# Patient Record
Sex: Male | Born: 2000 | Race: White | Hispanic: No | Marital: Single | Smoking: Never smoker
Health system: Southern US, Community
[De-identification: ages and names within clinical notes are randomized; demographics above are authoritative.]

## PROBLEM LIST (undated history)

## (undated) DIAGNOSIS — F32A Depression, unspecified: Secondary | ICD-10-CM

## (undated) DIAGNOSIS — F419 Anxiety disorder, unspecified: Secondary | ICD-10-CM

## (undated) HISTORY — DX: Anxiety disorder, unspecified: F41.9

## (undated) HISTORY — DX: Depression, unspecified: F32.A

---
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
## 2019-04-15 DIAGNOSIS — Z20828 Contact with and (suspected) exposure to other viral communicable diseases: Secondary | ICD-10-CM | POA: Diagnosis not present

## 2020-11-07 DIAGNOSIS — Z20822 Contact with and (suspected) exposure to covid-19: Secondary | ICD-10-CM | POA: Diagnosis not present

## 2020-11-29 DIAGNOSIS — Z20822 Contact with and (suspected) exposure to covid-19: Secondary | ICD-10-CM | POA: Diagnosis not present

## 2021-01-04 DIAGNOSIS — Z23 Encounter for immunization: Secondary | ICD-10-CM | POA: Diagnosis not present

## 2021-04-12 DIAGNOSIS — Z6821 Body mass index (BMI) 21.0-21.9, adult: Secondary | ICD-10-CM | POA: Diagnosis not present

## 2021-04-12 DIAGNOSIS — K645 Perianal venous thrombosis: Secondary | ICD-10-CM | POA: Diagnosis not present

## 2021-07-23 DIAGNOSIS — Z6824 Body mass index (BMI) 24.0-24.9, adult: Secondary | ICD-10-CM | POA: Diagnosis not present

## 2021-07-23 DIAGNOSIS — R5383 Other fatigue: Secondary | ICD-10-CM | POA: Diagnosis not present

## 2021-07-23 DIAGNOSIS — R635 Abnormal weight gain: Secondary | ICD-10-CM | POA: Diagnosis not present

## 2021-07-24 DIAGNOSIS — R5383 Other fatigue: Secondary | ICD-10-CM | POA: Diagnosis not present

## 2021-07-24 LAB — LIPID PANEL
Cholesterol: 136 (ref 0–200)
HDL: 48 (ref 35–70)
LDL Cholesterol: 71
LDl/HDL Ratio: 1.5
Triglycerides: 89 (ref 40–160)

## 2021-07-24 LAB — TESTOSTERONE: Testosterone: 593

## 2021-07-24 LAB — CBC AND DIFFERENTIAL: Neutrophils Absolute: 3.1

## 2021-10-15 DIAGNOSIS — F329 Major depressive disorder, single episode, unspecified: Secondary | ICD-10-CM | POA: Diagnosis not present

## 2021-10-15 DIAGNOSIS — Z1331 Encounter for screening for depression: Secondary | ICD-10-CM | POA: Diagnosis not present

## 2021-10-15 DIAGNOSIS — R197 Diarrhea, unspecified: Secondary | ICD-10-CM | POA: Diagnosis not present

## 2021-10-15 DIAGNOSIS — Z68.41 Body mass index (BMI) pediatric, 5th percentile to less than 85th percentile for age: Secondary | ICD-10-CM | POA: Diagnosis not present

## 2021-10-15 DIAGNOSIS — Z23 Encounter for immunization: Secondary | ICD-10-CM | POA: Diagnosis not present

## 2021-10-15 LAB — BASIC METABOLIC PANEL
BUN: 12 (ref 4–21)
CO2: 25 — AB (ref 13–22)
Chloride: 101 (ref 99–108)
Creatinine: 1.2 (ref 0.6–1.3)
Glucose: 91
Potassium: 4.9 mEq/L (ref 3.5–5.1)
Sodium: 141 (ref 137–147)

## 2021-10-15 LAB — HEPATIC FUNCTION PANEL
ALT: 17 U/L (ref 10–40)
AST: 13 — AB (ref 14–40)
Alkaline Phosphatase: 88 (ref 25–125)
Bilirubin, Total: 0.7

## 2021-10-15 LAB — COMPREHENSIVE METABOLIC PANEL
Albumin: 4.8 (ref 3.5–5.0)
Calcium: 10.1 (ref 8.7–10.7)
Globulin: 2.4
eGFR: 89

## 2021-10-15 LAB — TSH: TSH: 1.57 (ref 0.41–5.90)

## 2021-10-15 LAB — CBC: RBC: 6.01 — AB (ref 3.87–5.11)

## 2021-10-15 LAB — CBC AND DIFFERENTIAL
HCT: 52 (ref 41–53)
Hemoglobin: 17.3 (ref 13.5–17.5)
Platelets: 236 10*3/uL (ref 150–400)
WBC: 5

## 2021-11-15 DIAGNOSIS — F329 Major depressive disorder, single episode, unspecified: Secondary | ICD-10-CM | POA: Diagnosis not present

## 2021-11-15 DIAGNOSIS — Z6824 Body mass index (BMI) 24.0-24.9, adult: Secondary | ICD-10-CM | POA: Diagnosis not present

## 2022-12-08 DIAGNOSIS — F32A Depression, unspecified: Secondary | ICD-10-CM | POA: Diagnosis not present

## 2023-02-20 ENCOUNTER — Ambulatory Visit
Admission: RE | Admit: 2023-02-20 | Discharge: 2023-02-20 | Disposition: A | Discharge status: Home / Self Care | Payer: BC Managed Care – PPO | Attending: Physician Assistant | Admitting: Physician Assistant

## 2023-02-20 ENCOUNTER — Ambulatory Visit (INDEPENDENT_AMBULATORY_CARE_PROVIDER_SITE_OTHER): Payer: Self-pay | Admitting: Physician Assistant

## 2023-02-20 ENCOUNTER — Encounter: Payer: Self-pay | Admitting: Physician Assistant

## 2023-02-20 ENCOUNTER — Ambulatory Visit
Admission: RE | Admit: 2023-02-20 | Discharge: 2023-02-20 | Disposition: A | Discharge status: Home / Self Care | Payer: BC Managed Care – PPO | Source: Ambulatory Visit | Attending: Physician Assistant | Admitting: Physician Assistant

## 2023-02-20 VITALS — BP 100/60 | HR 62 | Temp 98.7°F | Ht 74.0 in | Wt 230.0 lb

## 2023-02-20 DIAGNOSIS — M79642 Pain in left hand: Secondary | ICD-10-CM

## 2023-02-20 DIAGNOSIS — F419 Anxiety disorder, unspecified: Secondary | ICD-10-CM | POA: Insufficient documentation

## 2023-02-20 DIAGNOSIS — M79641 Pain in right hand: Secondary | ICD-10-CM | POA: Insufficient documentation

## 2023-02-20 DIAGNOSIS — R41840 Attention and concentration deficit: Secondary | ICD-10-CM

## 2023-02-20 DIAGNOSIS — F411 Generalized anxiety disorder: Secondary | ICD-10-CM | POA: Insufficient documentation

## 2023-02-20 MED ORDER — ESCITALOPRAM OXALATE 20 MG PO TABS: 20.0000 mg | ORAL_TABLET | Freq: Every day | ORAL | 1 refills | Status: DC

## 2023-02-20 MED ORDER — DICLOFENAC SODIUM 1 % EX GEL: 2.0000 g | Freq: Four times a day (QID) | CUTANEOUS | 1 refills | Status: DC

## 2023-02-20 NOTE — Progress Notes (Signed)
Date:  02/20/2023   Name:  [name]   DOB:  Feb 03, 2001   MRN:  [id number]   Chief Complaint: Establish Care, ADHD, and Hand Pain  Hand Pain  Incident onset: X1 month. The incident occurred at the gym. The injury mechanism was repetitive motion. Pain location: left and right knuckles. Quality: discomfort. The pain does not radiate. The pain is at a severity of 3/10. The pain is mild. Pain course: comes and goes with movement. The symptoms are aggravated by movement. Treatments tried: ibuprofen. The treatment provided mild relief.   HPI [name] is a pleasant 22 year old male with a history of anxiety and depression who presents new to this practice, but previously seen by me at another practice, here to establish care and refill Lexapro which he feels is controlling mood disorders well and at a stable dose.  He also expresses interest in restarting medication for ADHD, states he was diagnosed as a child but has not taken medication for this problem in years.  Recently, college courses have prompted him to reconsider using medication to increase focus.  Primarily reports difficulty concentrating.  In addition, he would like me to evaluate his knuckles following injury from repeated impact having started boxing about 1 month ago and practicing on a punching bag with bare hands.  After the first day of practice, he noticed bilateral knuckle pain, but continued to return to the punching bag for several days in a row with worsening pain.  Eventually realized he should stop, but reports continued pain when he applies pressure to a closed fist.  He has taken a break from the punching bag.  He would be interested in x-ray today to rule out any fractures, given his persistent pain.   Recent Labs  No results found for: "NA", "K", "CL", "CO2", "GLUCOSE", "BUN", "CREATININE", "CALCIUM", "PROT", "ALBUMIN", "AST", "ALT", "ALKPHOS", "BILITOT", "GFRNONAA", "GFRAA"  No results found for: "WBC", "HGB",  "HCT", "MCV", "PLT" No results found for: "HGBA1C" No results found for: "CHOL", "HDL", "LDLCALC", "LDLDIRECT", "TRIG", "CHOLHDL" No results found for: "TSH"  Review of Systems  Musculoskeletal:        Bilateral knuckle pain    Patient Active Problem List   Diagnosis Date Noted   Generalized anxiety disorder 02/20/2023    Not on File  History reviewed. No pertinent surgical history.  Social History   Tobacco Use   Smoking status: Never   Smokeless tobacco: Never  Vaping Use   Vaping Use: Never used  Substance Use Topics   Alcohol use: Not Currently   Drug use: Never     Medication list has been reviewed and updated.  Current Meds  Medication Sig   diclofenac Sodium (VOLTAREN) 1 % GEL Apply 2 g topically 4 (four) times daily. Use on affected joint up to 4x/day as needed. 2 grams is roughly 4 fingertips' worth of gel.   [DISCONTINUED] escitalopram (LEXAPRO) 20 MG tablet Take 20 mg by mouth daily.       02/20/2023    3:23 PM  GAD 7 : Generalized Anxiety Score  Nervous, Anxious, on Edge 1  Control/stop worrying 1  Worry too much - different things 1  Trouble relaxing 0  Restless 1  Easily annoyed or irritable 1  Afraid - awful might happen 1  Total GAD 7 Score 6  Anxiety Difficulty Not difficult at all       02/20/2023    3:22 PM  Depression screen PHQ 2/9  Decreased Interest 0  Down, Depressed, Hopeless 0  PHQ - 2 Score 0  Altered sleeping 0  Tired, decreased energy 1  Change in appetite 0  Feeling bad or failure about yourself  1  Trouble concentrating 3  Moving slowly or fidgety/restless 0  Suicidal thoughts 0  PHQ-9 Score 5  Difficult doing work/chores Not difficult at all   Adult ADHD Self Report Scale (most recent)     Adult ADHD Self-Report Scale (ASRS-v1.1) Symptom Checklist - 02/20/23 1518       Part A   1. How often do you have trouble wrapping up the final details of a project, once the challenging parts have been done? Very Often  2.  How often do you have difficulty getting things done in order when you have to do a task that requires organization? Often    3. How often do you have problems remembering appointments or obligations? Rarely  4. When you have a task that requires a lot of thought, how often do you avoid or delay getting started? Very Often    5. How often do you fidget or squirm with your hands or feet when you have to sit down for a long time? Often  6. How often do you feel overly active and compelled to do things, like you were driven by a motor? Sometimes      Part B   7. How often do you make careless mistakes when you have to work on a boring or difficult project? Often  8. How often do you have difficulty keeping your attention when you are doing boring or repetitive work? Very Often    9. How often do you have difficulty concentrating on what people say to you, even when they are speaking to you directly? Sometimes  10. How often do you misplace or have difficulty finding things at home or at work? Sometimes    11. How often are you distracted by activity or noise around you? Sometimes  12. How often do you leave your seat in meetings or other situations in which you are expected to remain seated? Rarely    13. How often do you feel restless or fidgety? Very Often  14. How often do you have difficulty unwinding and relaxing when you have time to yourself? Rarely    15. How often do you find yourself talking too much when you are in social situations? Sometimes  16. When you are in a conversation, how often do you find yourself finishing the sentences of the people you are talking to, before they can finish them themselves? Rarely    17. How often do you have difficulty waiting your turn in situations when turn taking is required? Rarely  18. How often do you interrupt others when they are busy? Rarely      Comment   How old were you when these problems first began to occur? 13               BP Readings  from Last 3 Encounters:  02/20/23 100/60    Wt Readings from Last 3 Encounters:  02/20/23 230 lb (104.3 kg)    BP 100/60   Pulse 62   Temp 98.7 F (37.1 C) (Oral)   Ht 6\' 2"  (1.88 m)   Wt 230 lb (104.3 kg)   SpO2 97%   BMI 29.53 kg/m   Physical Exam Vitals and nursing note reviewed.  Constitutional:      Appearance: Normal appearance.  Cardiovascular:  Rate and Rhythm: Normal rate and regular rhythm.     Heart sounds: Normal heart sounds.  Pulmonary:     Effort: Pulmonary effort is normal.     Breath sounds: Normal breath sounds.  Musculoskeletal:     Comments: No obvious deformity of either hand. Central knuckles are prominent, but patient states this is baseline. No erythema, ecchymoses, or breaks in the skin. Mild tenderness at the MCPs but only with significant axial pressure on closed fist. Full ROM of fingers and wrist.   Psychiatric:        Mood and Affect: Mood normal.        Behavior: Behavior normal.      Assessment and Plan:  1. Generalized anxiety disorder Stable on Lexapro, does not need refill at this time but will send additional refills available to him upon request - escitalopram (LEXAPRO) 20 MG tablet; Take 1 tablet (20 mg total) by mouth daily.  Dispense: 90 tablet; Refill: 1  2. Bilateral hand pain Low suspicion for fracture, more likely this is acute insult from repetitive trauma with a punching bag.  Will obtain x-rays today to confirm.  Advised patient try applying topical diclofenac to the knuckles, and if this is ineffective he should use oral NSAIDs.  Regardless of the treatment regimen, I advised he take at least another 2 weeks away from the punching bag to allow proper time to heal.  When he does return, emphasized the importance of padding the hands/knuckles either with boxing tape or gloves. - DG Hand Complete Right - DG Hand Complete Left - diclofenac Sodium (VOLTAREN) 1 % GEL; Apply 2 g topically 4 (four) times daily. Use on affected  joint up to 4x/day as needed. 2 grams is roughly 4 fingertips' worth of gel.  Dispense: 100 g; Refill: 1  3. Difficulty concentrating ASRS today would suggest ADHD.  Given his concurrent anxiety disorder, need to use caution with stimulant medications to avoid exacerbating anxiety.  Because of this, submitting referral to behavioral health for adult ADHD evaluation. Depending on when they can see him, might consider trying nonstimulant alternative in the interim such as atomoxetine or guanfacine - Ambulatory referral to Psychiatry   Return in about 3 months (around 05/22/2023) for OV dep/anx/ADHD.   Partially dictated using Animal nutritionist. Any errors are unintentional.  Alvester Morin, PA-C, DMSc, Nutritionist [facility] [phone]

## 2023-03-10 ENCOUNTER — Telehealth: Payer: Self-pay

## 2023-03-10 NOTE — Telephone Encounter (Signed)
Received fax stating medication was not covered by insurance and pt dd not fill the prescription. Called pt to see if he needed an alternative sent in. Could not leave VM. VM was full.  KP

## 2023-03-10 NOTE — Telephone Encounter (Signed)
Called patient to advise him that diclofenac was not covered by insurance. No answer, VM full.

## 2023-03-10 NOTE — Telephone Encounter (Signed)
Pt said it was ok.  He does not really need it right now.

## 2023-03-11 NOTE — Telephone Encounter (Signed)
Noted  KP 

## 2023-03-24 ENCOUNTER — Telehealth: Payer: Self-pay | Admitting: Physician Assistant

## 2023-03-24 NOTE — Telephone Encounter (Signed)
Referral Request - Has patient seen PCP for this complaint? yes *If NO, is insurance requiring patient see PCP for this issue before PCP can refer them? Referral for which specialty: [facility]la [facility] Preferred provider/office: ? Reason for referral: short attention span

## 2023-03-25 ENCOUNTER — Telehealth: Payer: Self-pay | Admitting: Physician Assistant

## 2023-03-25 NOTE — Telephone Encounter (Signed)
Copied from CRM [id number]. Topic: General - Other >> Mar 25, 2023  9:26 AM Carrielelia G wrote: Reason for CRM:  pt is calling in regards to [facility] referral, that he discussed with PA Mordecai Maes

## 2023-03-25 NOTE — Telephone Encounter (Signed)
Patient called saying he might have missed a call from [facility] for his referral for behavioral health.  Would like # for their office. Gave # to patient and told him to call them, and schedule appt.  - Rebekha Diveley

## 2023-03-26 NOTE — Telephone Encounter (Signed)
Pt was called by Chassidy and given the number to [facility]. Please review previous telephone call.  KP

## 2023-05-25 ENCOUNTER — Ambulatory Visit: Payer: BC Managed Care – PPO | Admitting: Physician Assistant

## 2023-05-26 ENCOUNTER — Encounter: Payer: Self-pay | Admitting: Physician Assistant

## 2023-09-30 ENCOUNTER — Encounter: Payer: Self-pay | Admitting: Physician Assistant

## 2023-09-30 ENCOUNTER — Ambulatory Visit: Payer: BC Managed Care – PPO | Admitting: Physician Assistant

## 2023-09-30 VITALS — BP 110/76 | HR 92 | Temp 97.7°F | Ht 74.0 in | Wt 240.0 lb

## 2023-09-30 DIAGNOSIS — F411 Generalized anxiety disorder: Secondary | ICD-10-CM

## 2023-09-30 DIAGNOSIS — Z0000 Encounter for general adult medical examination without abnormal findings: Secondary | ICD-10-CM | POA: Diagnosis not present

## 2023-09-30 MED ORDER — ESCITALOPRAM OXALATE 10 MG PO TABS: 10.0000 mg | ORAL_TABLET | Freq: Every day | ORAL | 1 refills | Status: DC

## 2023-09-30 NOTE — Patient Instructions (Signed)
-  It was a pleasure to see you today! Please review your visit summary for helpful information -Lab results are usually available within 1-2 days and we will call once reviewed -I would encourage you to follow your care via MyChart where you can access lab results, notes, messages, and more -If you feel that we did a nice job today, please complete your after-visit survey and leave us a Google review! Your CMA today was Kieandra and your provider was Dan Waddell, PA-C, DMSc -Please return for follow-up in about 1 year  

## 2023-09-30 NOTE — Progress Notes (Signed)
Date:  09/30/2023   Name:  [name]   DOB:  2001-07-23   MRN:  [id number]   Chief Complaint: Anxiety and Annual Exam  HPI Yuepheng presents for routine physical in addition to f/u on GAD and suspected ADHD for which he was referred to [facility] last visit but never went. Patient informs me he is not enrolled in classes this semester due to financial stressors, just working full-time.  Is interested in tapering off escitalopram.  He exercises regularly (every other day for 90 minutes) and consumes ~200g protein daily.  Feels well overall.  Would like to check routine labs, admits he does consume a fair amount of fast food.  Last Physical: 2023 Last Dental Exam: 2022  Last Eye Exam: Uncertain     Medication list has been reviewed and updated.  Current Meds  Medication Sig   [DISCONTINUED] escitalopram (LEXAPRO) 20 MG tablet Take 1 tablet (20 mg total) by mouth daily.     Review of Systems  Constitutional:  Negative for activity change, appetite change, fatigue and unexpected weight change.  HENT:  Negative for dental problem, hearing loss and trouble swallowing.   Eyes:  Negative for visual disturbance.  Respiratory:  Negative for cough, chest tightness, shortness of breath and wheezing.   Cardiovascular:  Negative for chest pain, palpitations and leg swelling.  Gastrointestinal:  Negative for abdominal distention, abdominal pain, blood in stool, constipation and diarrhea.  Endocrine: Negative for polydipsia, polyphagia and polyuria.  Genitourinary:  Negative for difficulty urinating, dysuria, enuresis, frequency, hematuria and testicular pain.  Musculoskeletal:  Negative for arthralgias, gait problem and joint swelling.  Skin:  Negative for rash and wound.  Neurological:  Negative for dizziness, syncope, weakness and headaches.  Psychiatric/Behavioral:  Positive for decreased concentration, dysphoric mood and sleep disturbance. Negative for behavioral problems. The patient is  nervous/anxious.     Patient Active Problem List   Diagnosis Date Noted   Generalized anxiety disorder 02/20/2023    Not on File   There is no immunization history on file for this patient.  History reviewed. No pertinent surgical history.  Social History   Tobacco Use   Smoking status: Never   Smokeless tobacco: Never  Vaping Use   Vaping status: Never Used  Substance Use Topics   Alcohol use: Not Currently   Drug use: Never    Family History  Problem Relation Age of Onset   Cancer Father    Diabetes Maternal Grandfather         09/30/2023   10:55 AM 02/20/2023    3:23 PM  GAD 7 : Generalized Anxiety Score  Nervous, Anxious, on Edge 1 1  Control/stop worrying 1 1  Worry too much - different things 1 1  Trouble relaxing 1 0  Restless 1 1  Easily annoyed or irritable 2 1  Afraid - awful might happen 1 1  Total GAD 7 Score 8 6  Anxiety Difficulty Not difficult at all Not difficult at all       09/30/2023   10:55 AM 02/20/2023    3:22 PM  Depression screen PHQ 2/9  Decreased Interest 1 0  Down, Depressed, Hopeless 1 0  PHQ - 2 Score 2 0  Altered sleeping 2 0  Tired, decreased energy 1 1  Change in appetite 1 0  Feeling bad or failure about yourself  2 1  Trouble concentrating 2 3  Moving slowly or fidgety/restless 1 0  Suicidal thoughts 0 0  PHQ-9  Score 11 5  Difficult doing work/chores Not difficult at all Not difficult at all    BP Readings from Last 3 Encounters:  09/30/23 110/76  02/20/23 100/60    Wt Readings from Last 3 Encounters:  09/30/23 240 lb (108.9 kg)  02/20/23 230 lb (104.3 kg)    BP 110/76   Pulse 92   Temp 97.7 F (36.5 C) (Oral)   Ht 6\' 2"  (1.88 m)   Wt 240 lb (108.9 kg)   SpO2 96%   BMI 30.81 kg/m   Physical Exam Vitals and nursing note reviewed.  Constitutional:      Appearance: Normal appearance.  HENT:     Ears:     Comments: EAC clear bilaterally with good view of TM which is without effusion or erythema.      Nose: Nose normal.     Mouth/Throat:     Mouth: Mucous membranes are moist. No oral lesions.     Dentition: Normal dentition.     Pharynx: No posterior oropharyngeal erythema.  Eyes:     Extraocular Movements: Extraocular movements intact.     Conjunctiva/sclera: Conjunctivae normal.     Pupils: Pupils are equal, round, and reactive to light.  Neck:     Thyroid: No thyromegaly.  Cardiovascular:     Rate and Rhythm: Normal rate and regular rhythm.     Heart sounds: No murmur heard.    No friction rub. No gallop.     Comments: Pulses 2+ at radial, PT, DP bilaterally. No carotid bruit. No peripheral edema Pulmonary:     Effort: Pulmonary effort is normal.     Breath sounds: Normal breath sounds.  Abdominal:     General: Bowel sounds are normal.     Palpations: Abdomen is soft. There is no mass.     Tenderness: There is no abdominal tenderness.  Genitourinary:    Comments: Genital/rectal exam deferred. Reviewed technique for testicular self-exam. Musculoskeletal:     Comments: Full ROM with strength 5/5 bilateral upper and lower extremities  Lymphadenopathy:     Cervical: No cervical adenopathy.  Skin:    General: Skin is warm.     Capillary Refill: Capillary refill takes less than 2 seconds.     Findings: No lesion or rash.  Neurological:     Mental Status: He is alert and oriented to person, place, and time.     Gait: Gait is intact.  Psychiatric:        Mood and Affect: Mood normal. Affect is flat.        Behavior: Behavior normal.     Recent Labs     Component Value Date/Time   NA 141 10/15/2021 0000   K 4.9 10/15/2021 0000   CL 101 10/15/2021 0000   CO2 25 (A) 10/15/2021 0000   BUN 12 10/15/2021 0000   CREATININE 1.2 10/15/2021 0000   CALCIUM 10.1 10/15/2021 0000   ALBUMIN 4.8 10/15/2021 0000   AST 13 (A) 10/15/2021 0000   ALT 17 10/15/2021 0000   ALKPHOS 88 10/15/2021 0000    Lab Results  Component Value Date   WBC 5.0 10/15/2021   HGB 17.3  10/15/2021   HCT 52 10/15/2021   PLT 236 10/15/2021   No results found for: "HGBA1C" Lab Results  Component Value Date   CHOL 136 07/24/2021   HDL 48 07/24/2021   LDLCALC 71 07/24/2021   TRIG 89 07/24/2021   Lab Results  Component Value Date   TSH 1.57 10/15/2021  Assessment and Plan:  1. Annual physical exam Seemingly healthy patient with no abnormalities on exam. Encouraged healthy lifestyle including regular physical activity and consumption of whole fruits and vegetables. Encouraged routine dental and eye exams. STI screening offered but deferred, asymptomatic.   - CBC with Differential/Platelet - Comprehensive metabolic panel - TSH  2. Generalized anxiety disorder Discussed with patient that his GAD and PHQ scores have increased compared to last visit, which he attributes mainly to financial stressors.  Despite this, he would still like to proceed with taper off of Lexapro.  He has at least 7 of the 20 mg tablets remaining, so we discussed a taper regimen of 0.5 tablet daily x1wk followed by 0.5 tablet every other day until gone. Sending refill at the 10mg  dose in case he desires a slower taper.   - escitalopram (LEXAPRO) 10 MG tablet; Take 1 tablet (10 mg total) by mouth daily.  Dispense: 30 tablet; Refill: 1   Return in about 1 year (around 09/29/2024) for CPE.    Alvester Morin, PA-C, DMSc, Nutritionist [facility] [phone]

## 2023-10-01 LAB — CBC WITH DIFFERENTIAL/PLATELET
Basophils Absolute: 0 10*3/uL (ref 0.0–0.2)
Basos: 1 %
EOS (ABSOLUTE): 0.1 10*3/uL (ref 0.0–0.4)
Eos: 2 %
Hematocrit: 50.2 % (ref 37.5–51.0)
Hemoglobin: 16.2 g/dL (ref 13.0–17.7)
Immature Grans (Abs): 0 10*3/uL (ref 0.0–0.1)
Immature Granulocytes: 0 %
Lymphocytes Absolute: 1.5 10*3/uL (ref 0.7–3.1)
Lymphs: 28 %
MCH: 28.5 pg (ref 26.6–33.0)
MCHC: 32.3 g/dL (ref 31.5–35.7)
MCV: 88 fL (ref 79–97)
Monocytes Absolute: 0.5 10*3/uL (ref 0.1–0.9)
Monocytes: 9 %
Neutrophils Absolute: 3.3 10*3/uL (ref 1.4–7.0)
Neutrophils: 60 %
Platelets: 239 10*3/uL (ref 150–450)
RBC: 5.69 x10E6/uL (ref 4.14–5.80)
RDW: 13.2 % (ref 11.6–15.4)
WBC: 5.5 10*3/uL (ref 3.4–10.8)

## 2023-10-01 LAB — COMPREHENSIVE METABOLIC PANEL
ALT: 38 [IU]/L (ref 0–44)
AST: 24 [IU]/L (ref 0–40)
Albumin: 4.4 g/dL (ref 4.3–5.2)
Alkaline Phosphatase: 75 [IU]/L (ref 44–121)
BUN/Creatinine Ratio: 17 (ref 9–20)
BUN: 18 mg/dL (ref 6–20)
Bilirubin Total: 0.3 mg/dL (ref 0.0–1.2)
CO2: 27 mmol/L (ref 20–29)
Calcium: 9.8 mg/dL (ref 8.7–10.2)
Chloride: 99 mmol/L (ref 96–106)
Creatinine, Ser: 1.07 mg/dL (ref 0.76–1.27)
Globulin, Total: 2.6 g/dL (ref 1.5–4.5)
Glucose: 69 mg/dL — ABNORMAL LOW (ref 70–99)
Potassium: 4.6 mmol/L (ref 3.5–5.2)
Sodium: 139 mmol/L (ref 134–144)
Total Protein: 7 g/dL (ref 6.0–8.5)
eGFR: 101 mL/min/{1.73_m2} (ref >59)

## 2023-10-01 LAB — TSH: TSH: 1.38 u[IU]/mL (ref 0.450–4.500)

## 2023-10-02 ENCOUNTER — Other Ambulatory Visit: Payer: Self-pay | Admitting: Physician Assistant

## 2023-10-14 ENCOUNTER — Other Ambulatory Visit: Payer: Self-pay | Admitting: Physician Assistant

## 2023-10-14 DIAGNOSIS — F411 Generalized anxiety disorder: Secondary | ICD-10-CM

## 2023-10-16 NOTE — Telephone Encounter (Signed)
Requested medications are due for refill today.  yes  Requested medications are on the active medications list.  yes  Last refill. 09/30/2023 #30 1 rf  Future visit scheduled.   yes  Notes to clinic.  Provider sent a message to pt. Pt was to taper off of this medication. No response from pt yet. Please advise.    Requested Prescriptions  Pending Prescriptions Disp Refills   escitalopram (LEXAPRO) 10 MG tablet [Pharmacy Med Name: ESCITALOPRAM 10 MG TABLET] 90 tablet 1    Sig: TAKE 1 TABLET BY MOUTH EVERY DAY     Psychiatry:  Antidepressants - SSRI Passed - 10/14/2023  8:29 AM      Passed - Valid encounter within last 6 months    Recent Outpatient Visits           2 weeks ago Annual physical exam   [facility] at [facility], [place], [place]   7 months ago Generalized anxiety disorder   [facility] at [facility], [place] Alar, [place]       Future Appointments             In 11 months Mordecai Maes, [place] Alar, PA [facility] at [facility], [facility]

## 2023-10-20 ENCOUNTER — Other Ambulatory Visit: Payer: Self-pay | Admitting: Physician Assistant

## 2023-10-21 ENCOUNTER — Ambulatory Visit: Payer: BC Managed Care – PPO | Admitting: Physician Assistant

## 2023-10-21 ENCOUNTER — Encounter: Payer: Self-pay | Admitting: Physician Assistant

## 2023-10-21 VITALS — BP 118/76 | HR 81 | Temp 97.7°F | Ht 74.0 in | Wt 238.0 lb

## 2023-10-21 DIAGNOSIS — G44209 Tension-type headache, unspecified, not intractable: Secondary | ICD-10-CM | POA: Diagnosis not present

## 2023-10-21 MED ORDER — BUTALBITAL-APAP-CAFFEINE 50-325-40 MG PO TABS: 1.0000 | ORAL_TABLET | Freq: Four times a day (QID) | ORAL | 1 refills | Status: DC | PRN

## 2023-10-21 NOTE — Progress Notes (Signed)
Date:  10/21/2023   Name:  [name]   DOB:  02-12-2001   MRN:  [id number]   Chief Complaint: Headache  Headache  This is a chronic problem. Episode onset: 3-4 weeks. Episode frequency: comes and goes. The problem has been unchanged. The pain is located in the Frontal and temporal region. The pain does not radiate. The pain quality is similar to prior headaches. The quality of the pain is described as aching and sharp. The pain is at a severity of 5/10. The pain is moderate. Associated symptoms include eye pain. The symptoms are aggravated by bright light (caffeine). He has tried NSAIDs for the symptoms. The treatment provided no relief.   Xain presents for evaluation of near-daily frontal/temporal headaches for the past 3 weeks or so. He does not wake up with the headaches, but shortly after waking he thinks it starts to come on. He thinks it could be related to stress. Also recently tapered escitalopram. Has been taking ibuprofen almost daily.    Medication list has been reviewed and updated.  Current Meds  Medication Sig   butalbital-acetaminophen-caffeine (FIORICET) 50-325-40 MG tablet Take 1 tablet by mouth every 6 (six) hours as needed for headache.     Review of Systems  Eyes:  Positive for pain.  Neurological:  Positive for headaches.    Patient Active Problem List   Diagnosis Date Noted   Generalized anxiety disorder 02/20/2023    No Known Allergies   There is no immunization history on file for this patient.  History reviewed. No pertinent surgical history.  Social History   Tobacco Use   Smoking status: Never   Smokeless tobacco: Never  Vaping Use   Vaping status: Never Used  Substance Use Topics   Alcohol use: Not Currently   Drug use: Never    Family History  Problem Relation Age of Onset   Cancer Father    Diabetes Maternal Grandfather         10/21/2023    9:09 AM 09/30/2023   10:55 AM 02/20/2023    3:23 PM  GAD 7 : Generalized  Anxiety Score  Nervous, Anxious, on Edge 0 1 1  Control/stop worrying 0 1 1  Worry too much - different things 0 1 1  Trouble relaxing 1 1 0  Restless 0 1 1  Easily annoyed or irritable 1 2 1   Afraid - awful might happen 0 1 1  Total GAD 7 Score 2 8 6   Anxiety Difficulty Not difficult at all Not difficult at all Not difficult at all       10/21/2023    9:08 AM 09/30/2023   10:55 AM 02/20/2023    3:22 PM  Depression screen PHQ 2/9  Decreased Interest 0 1 0  Down, Depressed, Hopeless 0 1 0  PHQ - 2 Score 0 2 0  Altered sleeping 2 2 0  Tired, decreased energy 1 1 1   Change in appetite 1 1 0  Feeling bad or failure about yourself  0 2 1  Trouble concentrating 1 2 3   Moving slowly or fidgety/restless 0 1 0  Suicidal thoughts 0 0 0  PHQ-9 Score 5 11 5   Difficult doing work/chores Not difficult at all Not difficult at all Not difficult at all    BP Readings from Last 3 Encounters:  10/21/23 118/76  09/30/23 110/76  02/20/23 100/60    Wt Readings from Last 3 Encounters:  10/21/23 238 lb (108 kg)  09/30/23 240  lb (108.9 kg)  02/20/23 230 lb (104.3 kg)    BP 118/76   Pulse 81   Temp 97.7 F (36.5 C) (Oral)   Ht 6\' 2"  (1.88 m)   Wt 238 lb (108 kg)   SpO2 97%   BMI 30.56 kg/m   Physical Exam Vitals and nursing note reviewed.  Constitutional:      Appearance: Normal appearance.  HENT:     Head:     Comments: No tenderness of the frontal sinuses or temples.  Eyes:     Pupils: Pupils are equal, round, and reactive to light.  Cardiovascular:     Rate and Rhythm: Normal rate.  Pulmonary:     Effort: Pulmonary effort is normal.  Abdominal:     General: There is no distension.  Musculoskeletal:        General: Normal range of motion.  Skin:    General: Skin is warm and dry.  Neurological:     Mental Status: He is alert and oriented to person, place, and time.     Gait: Gait is intact.  Psychiatric:        Mood and Affect: Mood and affect normal.     Recent  Labs     Component Value Date/Time   NA 139 09/30/2023 1145   K 4.6 09/30/2023 1145   CL 99 09/30/2023 1145   CO2 27 09/30/2023 1145   GLUCOSE 69 (L) 09/30/2023 1145   BUN 18 09/30/2023 1145   CREATININE 1.07 09/30/2023 1145   CALCIUM 9.8 09/30/2023 1145   PROT 7.0 09/30/2023 1145   ALBUMIN 4.4 09/30/2023 1145   AST 24 09/30/2023 1145   ALT 38 09/30/2023 1145   ALKPHOS 75 09/30/2023 1145   BILITOT 0.3 09/30/2023 1145    Lab Results  Component Value Date   WBC 5.5 09/30/2023   HGB 16.2 09/30/2023   HCT 50.2 09/30/2023   MCV 88 09/30/2023   PLT 239 09/30/2023   No results found for: "HGBA1C" Lab Results  Component Value Date   CHOL 136 07/24/2021   HDL 48 07/24/2021   LDLCALC 71 07/24/2021   TRIG 89 07/24/2021   Lab Results  Component Value Date   TSH 1.380 09/30/2023     Assessment and Plan:  1. Tension headache Suspect tension headaches, likely multifactorial with contributing factors of escitalopram discontinuation, medication overuse/rebound headaches, recent weather changes including barometric variation.  Advised to alternate OTC analgesics, practice stress relief techniques, neck stretching, heat applied to the neck, good sleep.  Will prescribe Fioricet for use only when headache is quite severe.  Patient understands this may cause sedation. - butalbital-acetaminophen-caffeine (FIORICET) 50-325-40 MG tablet; Take 1 tablet by mouth every 6 (six) hours as needed for headache.  Dispense: 14 tablet; Refill: 1    Return in about 10 days (around 10/31/2023) for VIRTUAL f/u headaches.    Alvester Morin, PA-C, DMSc, Nutritionist [facility] Care and Sports Medicine [facility] [phone]

## 2024-03-29 ENCOUNTER — Ambulatory Visit: Payer: Self-pay

## 2024-03-29 DIAGNOSIS — M722 Plantar fascial fibromatosis: Secondary | ICD-10-CM | POA: Insufficient documentation

## 2024-03-29 NOTE — Telephone Encounter (Signed)
 Chief Complaint: anxiety Pertinent Negatives: Patient denies SI, HI, hallucinations, alcohol, drugs Disposition: [] ED /[] Urgent Care (no appt availability in office) / [x] Appointment(In office/virtual)/ []  [facility]/ [] Home Care/ [] Refused Recommended Disposition /[] [facility]/ []  Follow-up with PCP Additional Notes: Pt reports worsening anxiety and anxiety attacks. Pt reports stress, nausea, frequent bowel movements, and a "lightness" in his chest with the sensation of a fast heart rate with anxiety attacks. Pt states he has a hx of anxiety and OCD. Pt used to take Lexapro  and is interested in taking it again. When his anxiety spikes, pt rates it a 7 or 8 out of 10. Pt states his anxiety and stress makes work difficult. Pt denies SI, HI, hallucinations. RN scheduled pt for tomorrow at 1100, pt agreeable to that plan. RN advised pt if he develops CP, SOB, thoughts of hurting himself or others, hallucinations, or his anxiety becomes so severe he is unable to perform activities of daily living that he should go to the ED. Pt verbalized understanding.    Copied from CRM [id number]. Topic: Clinical - Red Word Triage >> Mar 29, 2024  2:02 PM Emylou G wrote: [facility] that prompted transfer to Nurse Triage: Med refill:escitalopram  .. Symptoms: anxiety and depression Reason for Disposition  MODERATE anxiety (e.g., persistent or frequent anxiety symptoms; interferes with sleep, school, or work)  Answer Assessment - Initial Assessment Questions 1. CONCERN: "Did anything happen that prompted you to call today?"      "Bad anxiety attacks" 2. ANXIETY SYMPTOMS: "Can you describe how you (your loved one; patient) have been feeling?" (e.g., tense, restless, panicky, anxious, keyed up, overwhelmed, sense of impending doom).      Very stressed out, nauseous, frequent bowel movements - occurs every day; anxiety and OCD 3. ONSET: "How long have you been feeling this way?" (e.g., hours, days,  weeks)     "I never really stopped after I came off the medicine, they've been worse the past few weeks"; used to take Lexapro  4. SEVERITY: "How would you rate the level of anxiety?" (e.g., 0 - 10; or mild, moderate, severe).     "When it spikes, a 7 or 8" 5. FUNCTIONAL IMPAIRMENT: "How have these feelings affected your ability to do daily activities?" "Have you had more difficulty than usual doing your normal daily activities?" (e.g., getting better, same, worse; self-care, school, work, interactions)     Endorses some difficulty with ADLs; "stressed out all the time, using the bathroom at work to [employer], that kind of stuff", states he is eating and drinking normally, sleep is normal  6. HISTORY: "Have you felt this way before?" "Have you ever been diagnosed with an anxiety problem in the past?" (e.g., generalized anxiety disorder, panic attacks, PTSD). If Yes, ask: "How was this problem treated?" (e.g., medicines, counseling, etc.)     Yes, used to take Lexapro   7. RISK OF HARM - SUICIDAL IDEATION: "Do you ever have thoughts of hurting or killing yourself?" If Yes, ask:  "Do you have these feelings now?" "Do you have a plan on how you would do this?"     Denies  8. TREATMENT:  "What has been done so far to treat this anxiety?" (e.g., medicines, relaxation strategies). "What has helped?"     Yes, but not particularly helpful  9. TREATMENT - THERAPIST: "Do you have a counselor or therapist? Name?"     No  10. POTENTIAL TRIGGERS: "Do you drink caffeinated beverages (e.g., coffee, colas, teas), and how  much daily?" "Do you drink alcohol or use any drugs?" "Have you started any new medicines recently?"       No drugs or alcohol; for 4-5 days pt states he has been taking 10mg  Lexapro  left over  11. PATIENT SUPPORT: "Who is with you now?" "Who do you live with?" "Do you have family or friends who you can talk to?"        "Switch back and forth between mom and dad", endorses support system  12. OTHER  SYMPTOMS: "Do you have any other symptoms?" (e.g., feeling depressed, trouble concentrating, trouble sleeping, trouble breathing, palpitations or fast heartbeat, chest pain, sweating, nausea, or diarrhea)       Nausea, frequent bathroom trips, fast heart rate and "lightness" of the chest; denies pain, has checked his pulse but it did not feel too fast  Protocols used: Anxiety and Panic Attack-A-AH

## 2024-03-29 NOTE — Telephone Encounter (Signed)
 Noted  KP

## 2024-03-30 ENCOUNTER — Ambulatory Visit: Admitting: Physician Assistant

## 2024-03-30 ENCOUNTER — Encounter: Payer: Self-pay | Admitting: Physician Assistant

## 2024-03-30 VITALS — BP 102/78 | HR 78 | Temp 98.2°F | Ht 74.0 in | Wt 215.0 lb

## 2024-03-30 DIAGNOSIS — M25512 Pain in left shoulder: Secondary | ICD-10-CM

## 2024-03-30 DIAGNOSIS — F411 Generalized anxiety disorder: Secondary | ICD-10-CM

## 2024-03-30 MED ORDER — ESCITALOPRAM OXALATE 10 MG PO TABS: 10.0000 mg | ORAL_TABLET | Freq: Every day | ORAL | 2 refills | Status: DC

## 2024-03-30 MED ORDER — DICLOFENAC SODIUM 1 % EX GEL: 2.0000 g | Freq: Four times a day (QID) | CUTANEOUS | 1 refills | Status: DC

## 2024-03-30 NOTE — Progress Notes (Signed)
 Date:  03/30/2024   Name:  [name]   DOB:  05-Nov-2001   MRN:  [id number]   Chief Complaint: Anxiety (Taking old lexapro  10 mg for the past 5 days /)  HPI [name] would like to restart Lexapro  after successful taper roughly 6 months ago. Says his anxiety and OCD have been flaring up. He self-restarted at the 10 mg dose about 5 days ago, but is requesting refill.   Left shoulder is "pinching" for a few weeks at the extremes of abduction. Had a particularly strenuous strength training workout before onset.    Medication list has been reviewed and updated.  Current Meds  Medication Sig   diclofenac  Sodium (VOLTAREN ) 1 % GEL Apply 2 g topically 4 (four) times daily. Use on affected joint up to 4x/day as needed. 2 grams is roughly 4 fingertips' worth of gel.   [DISCONTINUED] escitalopram  (LEXAPRO ) 10 MG tablet Take 10 mg by mouth.     Review of Systems  Patient Active Problem List   Diagnosis Date Noted   Generalized anxiety disorder 02/20/2023    No Known Allergies   There is no immunization history on file for this patient.  History reviewed. No pertinent surgical history.  Social History   Tobacco Use   Smoking status: Never   Smokeless tobacco: Never  Vaping Use   Vaping status: Never Used  Substance Use Topics   Alcohol use: Not Currently   Drug use: Never    Family History  Problem Relation Age of Onset   Cancer Father    Diabetes Maternal Grandfather         03/30/2024   11:02 AM 10/21/2023    9:09 AM 09/30/2023   10:55 AM 02/20/2023    3:23 PM  GAD 7 : Generalized Anxiety Score  Nervous, Anxious, on Edge 3 0 1 1  Control/stop worrying 3 0 1 1  Worry too much - different things 3 0 1 1  Trouble relaxing 3 1 1  0  Restless 1 0 1 1  Easily annoyed or irritable 1 1 2 1   Afraid - awful might happen 0 0 1 1  Total GAD 7 Score 14 2 8 6   Anxiety Difficulty Somewhat difficult Not difficult at all Not difficult at all Not difficult at all        03/30/2024   11:02 AM 10/21/2023    9:08 AM 09/30/2023   10:55 AM  Depression screen PHQ 2/9  Decreased Interest 3 0 1  Down, Depressed, Hopeless 2 0 1  PHQ - 2 Score 5 0 2  Altered sleeping 2 2 2   Tired, decreased energy 2 1 1   Change in appetite 3 1 1   Feeling bad or failure about yourself  2 0 2  Trouble concentrating 2 1 2   Moving slowly or fidgety/restless 1 0 1  Suicidal thoughts 0 0 0  PHQ-9 Score 17 5 11   Difficult doing work/chores Somewhat difficult Not difficult at all Not difficult at all    BP Readings from Last 3 Encounters:  03/30/24 102/78  10/21/23 118/76  09/30/23 110/76    Wt Readings from Last 3 Encounters:  03/30/24 215 lb (97.5 kg)  10/21/23 238 lb (108 kg)  09/30/23 240 lb (108.9 kg)    BP 102/78   Pulse 78   Temp 98.2 F (36.8 C)   Ht 6\' 2"  (1.88 m)   Wt 215 lb (97.5 kg)   SpO2 97%   BMI 27.60 kg/m  Physical Exam Vitals and nursing note reviewed.  Constitutional:      Appearance: Normal appearance.  Cardiovascular:     Rate and Rhythm: Normal rate.  Pulmonary:     Effort: Pulmonary effort is normal.  Abdominal:     General: There is no distension.  Musculoskeletal:        General: Normal range of motion.     Comments: No TTP at deltoid. Negative empty can. He does have some pain at the lateral aspect of left deltoid with full abduction.   Skin:    General: Skin is warm and dry.  Neurological:     Mental Status: He is alert and oriented to person, place, and time.     Gait: Gait is intact.  Psychiatric:        Mood and Affect: Mood and affect normal.     Recent Labs     Component Value Date/Time   NA 139 09/30/2023 1145   K 4.6 09/30/2023 1145   CL 99 09/30/2023 1145   CO2 27 09/30/2023 1145   GLUCOSE 69 (L) 09/30/2023 1145   BUN 18 09/30/2023 1145   CREATININE 1.07 09/30/2023 1145   CALCIUM 9.8 09/30/2023 1145   PROT 7.0 09/30/2023 1145   ALBUMIN 4.4 09/30/2023 1145   AST 24 09/30/2023 1145   ALT 38 09/30/2023 1145    ALKPHOS 75 09/30/2023 1145   BILITOT 0.3 09/30/2023 1145    Lab Results  Component Value Date   WBC 5.5 09/30/2023   HGB 16.2 09/30/2023   HCT 50.2 09/30/2023   MCV 88 09/30/2023   PLT 239 09/30/2023   No results found for: "HGBA1C" Lab Results  Component Value Date   CHOL 136 07/24/2021   HDL 48 07/24/2021   LDLCALC 71 07/24/2021   TRIG 89 07/24/2021   Lab Results  Component Value Date   TSH 1.380 09/30/2023     Assessment and Plan:  1. Generalized anxiety disorder (Primary) Restart lexapro . Reviewed taper schedule should he choose to d/c in the future. - escitalopram  (LEXAPRO ) 10 MG tablet; Take 1 tablet (10 mg total) by mouth daily.  Dispense: 90 tablet; Refill: 2  2. Acute pain of left shoulder Likely muscular strain, mild. Try topical NSAID and relative rest.  - diclofenac  Sodium (VOLTAREN ) 1 % GEL; Apply 2 g topically 4 (four) times daily. Use on affected joint up to 4x/day as needed. 2 grams is roughly 4 fingertips' worth of gel.  Dispense: 100 g; Refill: 1   Return in about 3 months (around 06/30/2024) for OV f/u anxiety.    Cody Das, PA-C, DMSc, Nutritionist [facility] [phone]

## 2024-05-09 NOTE — Telephone Encounter (Signed)
 Please review.  KP

## 2024-06-01 NOTE — Telephone Encounter (Unsigned)
 Copied from CRM [id number]. Topic: Clinical - Medication Refill >> Jun 01, 2024  3:19 PM Harlene ORN wrote: Medication: escitalopram  (LEXAPRO ) 10 MG tablet  Has the patient contacted their pharmacy? No (Agent: If no, request that the patient contact the pharmacy for the refill. If patient does not wish to contact the pharmacy document the reason why and proceed with request.) (Agent: If yes, when and what did the pharmacy advise?)  This is the patient's preferred pharmacy:   [facility] [phone] [address], [place] - [address] [place] [address] Phone: [phone] Fax: [phone]  Is this the correct pharmacy for this prescription? Yes If no, delete pharmacy and type the correct one.   Has the prescription been filled recently? No  Is the patient out of the medication? yes  Has the patient been seen for an appointment in the last year OR does the patient have an upcoming appointment? Yes  Can we respond through MyChart? Yes  Agent: Please be advised that Rx refills may take up to 3 business days. We ask that you follow-up with your pharmacy.

## 2024-06-02 ENCOUNTER — Other Ambulatory Visit: Payer: Self-pay | Admitting: Physician Assistant

## 2024-06-02 DIAGNOSIS — F411 Generalized anxiety disorder: Secondary | ICD-10-CM

## 2024-06-02 MED ORDER — ESCITALOPRAM OXALATE 20 MG PO TABS: 20.0000 mg | ORAL_TABLET | Freq: Every day | ORAL | 1 refills | Status: DC

## 2024-06-02 NOTE — Telephone Encounter (Signed)
 Is patient going to 20 MG?  KP

## 2024-06-30 ENCOUNTER — Encounter: Payer: Self-pay | Admitting: Physician Assistant

## 2024-06-30 ENCOUNTER — Ambulatory Visit: Admitting: Physician Assistant

## 2024-10-03 ENCOUNTER — Encounter: Payer: Self-pay | Admitting: Physician Assistant

## 2024-11-15 ENCOUNTER — Encounter: Payer: Self-pay | Admitting: Physician Assistant

## 2024-11-15 ENCOUNTER — Encounter: Admitting: Physician Assistant

## 2024-11-18 ENCOUNTER — Ambulatory Visit (INDEPENDENT_AMBULATORY_CARE_PROVIDER_SITE_OTHER): Admitting: Physician Assistant

## 2024-11-18 ENCOUNTER — Encounter: Payer: Self-pay | Admitting: Physician Assistant

## 2024-11-18 VITALS — BP 102/64 | HR 91 | Temp 98.1°F | Ht 74.0 in | Wt 208.0 lb

## 2024-11-18 DIAGNOSIS — M25512 Pain in left shoulder: Secondary | ICD-10-CM

## 2024-11-18 DIAGNOSIS — Z0000 Encounter for general adult medical examination without abnormal findings: Secondary | ICD-10-CM | POA: Diagnosis not present

## 2024-11-18 DIAGNOSIS — F411 Generalized anxiety disorder: Secondary | ICD-10-CM

## 2024-11-18 DIAGNOSIS — Z23 Encounter for immunization: Secondary | ICD-10-CM | POA: Diagnosis not present

## 2024-11-18 MED ORDER — ESCITALOPRAM OXALATE 20 MG PO TABS: 20.0000 mg | ORAL_TABLET | Freq: Every day | ORAL | 1 refills | Status: AC

## 2024-11-18 NOTE — Progress Notes (Signed)
 "   Date:  11/18/2024   Name:  [name]   DOB:  Oct 21, 2001   MRN:  [id number]   Chief Complaint: Annual Exam  HPI  Wali returns for routine physical.   Regarding his anxiety, in May, he was doing well with escitalopram  10 mg daily, but on 05/09/2024 he sent a message notifying me of desire for dose increase to 20 mg, which I refilled for him.  Last Physical: 2023 Last Dental Exam: 2025  Last Eye Exam: Uncertain Immunizations Due: Flu  Medication list has been reviewed and updated.  Active Medications[1]   Review of Systems  Patient Active Problem List   Diagnosis Date Noted   Plantar fasciitis 03/29/2024   Anxiety disorder 02/20/2023    Allergies[2]  Immunization History  Administered Date(s) Administered   Hepatitis A, Adult 11/01/2018   Influenza, Seasonal, Injecte, Preservative Fre 11/18/2024   Td 01/04/2021    History reviewed. No pertinent surgical history.  Social History[3]  Family History  Problem Relation Age of Onset   Cancer Father    Diabetes Maternal Grandfather         11/18/2024    3:49 PM 03/30/2024   11:02 AM 10/21/2023    9:09 AM 09/30/2023   10:55 AM  GAD 7 : Generalized Anxiety Score  Nervous, Anxious, on Edge 0 3 0 1  Control/stop worrying 0 3 0 1  Worry too much - different things 0 3 0 1  Trouble relaxing 0 3 1 1   Restless 0 1 0 1  Easily annoyed or irritable 1 1 1 2   Afraid - awful might happen 0 0 0 1  Total GAD 7 Score 1 14 2 8   Anxiety Difficulty Somewhat difficult Somewhat difficult Not difficult at all Not difficult at all       11/18/2024    3:49 PM 03/30/2024   11:02 AM 10/21/2023    9:08 AM  Depression screen PHQ 2/9  Decreased Interest 0 3 0  Down, Depressed, Hopeless 0 2 0  PHQ - 2 Score 0 5 0  Altered sleeping  2 2  Tired, decreased energy  2 1  Change in appetite  3 1  Feeling bad or failure about yourself   2 0  Trouble concentrating  2 1  Moving slowly or fidgety/restless  1 0  Suicidal thoughts  0  0  PHQ-9 Score  17  5   Difficult doing work/chores  Somewhat difficult Not difficult at all     Data saved with a previous flowsheet row definition    BP Readings from Last 3 Encounters:  11/18/24 102/64  03/30/24 102/78  10/21/23 118/76    Wt Readings from Last 3 Encounters:  11/18/24 208 lb (94.3 kg)  03/30/24 215 lb (97.5 kg)  10/21/23 238 lb (108 kg)    BP 102/64   Pulse 91   Temp 98.1 F (36.7 C)   Ht 6' 2 (1.88 m)   Wt 208 lb (94.3 kg)   SpO2 97%   BMI 26.71 kg/m   Physical Exam Vitals and nursing note reviewed.  Constitutional:      Appearance: Normal appearance.  HENT:     Ears:     Comments: EAC clear bilaterally with good view of TM which is without effusion or erythema.     Nose: Nose normal.     Mouth/Throat:     Mouth: Mucous membranes are moist. No oral lesions.     Dentition: Normal dentition.  Pharynx: No posterior oropharyngeal erythema.  Eyes:     Extraocular Movements: Extraocular movements intact.     Conjunctiva/sclera: Conjunctivae normal.     Pupils: Pupils are equal, round, and reactive to light.  Neck:     Thyroid: No thyromegaly.  Cardiovascular:     Rate and Rhythm: Normal rate and regular rhythm.     Heart sounds: No murmur heard.    No friction rub. No gallop.     Comments: Pulses 2+ at radial, PT, DP bilaterally. No carotid bruit. No peripheral edema Pulmonary:     Effort: Pulmonary effort is normal.     Breath sounds: Normal breath sounds.  Abdominal:     General: Bowel sounds are normal.     Palpations: Abdomen is soft. There is no mass.     Tenderness: There is no abdominal tenderness.  Genitourinary:    Comments: Genital/rectal exam deferred. Reviewed technique for testicular self-exam. Musculoskeletal:     Comments: Full ROM with strength 5/5 bilateral upper and lower extremities  Lymphadenopathy:     Cervical: No cervical adenopathy.  Skin:    General: Skin is warm.     Capillary Refill: Capillary refill takes  less than 2 seconds.     Findings: No lesion or rash.  Neurological:     Mental Status: He is alert and oriented to person, place, and time.     Gait: Gait is intact.  Psychiatric:        Mood and Affect: Mood normal.        Behavior: Behavior normal.     Recent Labs     Component Value Date/Time   NA 139 09/30/2023 1145   K 4.6 09/30/2023 1145   CL 99 09/30/2023 1145   CO2 27 09/30/2023 1145   GLUCOSE 69 (L) 09/30/2023 1145   BUN 18 09/30/2023 1145   CREATININE 1.07 09/30/2023 1145   CALCIUM 9.8 09/30/2023 1145   PROT 7.0 09/30/2023 1145   ALBUMIN 4.4 09/30/2023 1145   AST 24 09/30/2023 1145   ALT 38 09/30/2023 1145   ALKPHOS 75 09/30/2023 1145   BILITOT 0.3 09/30/2023 1145    Lab Results  Component Value Date   WBC 5.5 09/30/2023   HGB 16.2 09/30/2023   HCT 50.2 09/30/2023   MCV 88 09/30/2023   PLT 239 09/30/2023   No results found for: HGBA1C Lab Results  Component Value Date   CHOL 136 07/24/2021   HDL 48 07/24/2021   LDLCALC 71 07/24/2021   TRIG 89 07/24/2021   Lab Results  Component Value Date   TSH 1.380 09/30/2023      Assessment and Plan:  1. Annual physical exam (Primary) Encouraged healthy lifestyle including regular physical activity and consumption of whole fruits and vegetables. Encouraged routine dental and eye exams. Vaccinations up to date.   2. Generalized anxiety disorder Refilling escitalopram , well-controlled.  - escitalopram  (LEXAPRO ) 20 MG tablet; Take 1 tablet (20 mg total) by mouth daily.  Dispense: 90 tablet; Refill: 1  3. Encounter for immunization Flu shot given today.  - Flu vaccine trivalent PF, 6mos and older(Flulaval,Afluria,Fluarix,Fluzone)    F/u 29m OV anx/dep F/u 1y CPE   Rolan Hoyle, PA-C, DMSc, DipACLM, Nutritionist [facility] [phone]      [1]  Current Meds  Medication Sig   [DISCONTINUED] diclofenac  Sodium  (VOLTAREN ) 1 % GEL Apply 2 g topically 4 (four) times daily. Use on affected joint  up to 4x/day as needed. 2 grams is roughly 4 fingertips' worth of gel.   [DISCONTINUED] escitalopram  (LEXAPRO ) 20 MG tablet Take 1 tablet (20 mg total) by mouth daily.  [2] No Known Allergies [3]  Social History Tobacco Use   Smoking status: Never   Smokeless tobacco: Never  Vaping Use   Vaping status: Never Used  Substance Use Topics   Alcohol use: Not Currently   Drug use: Never   "

## 2024-11-18 NOTE — Addendum Note (Signed)
 Addended byBETHA MANYA ROLAN SHAUNNA on: 11/18/2024 04:13 PM   Modules accepted: Orders

## 2024-11-18 NOTE — Patient Instructions (Addendum)
-  It was a pleasure to see you today! Please review your visit summary for helpful information -I would encourage you to follow your care via MyChart where you can access lab results, notes, messages, and more -If you feel that we did a nice job today, please complete your after-visit survey and leave us a Google review! Your CMA today was Kieandra and your provider was Dan Waddell, PA-C, DMSc -Please return for follow-up in about 6 months  

## 2025-05-25 ENCOUNTER — Ambulatory Visit: Admitting: Physician Assistant

## 2025-11-20 ENCOUNTER — Encounter: Admitting: Physician Assistant
